# Patient Record
Sex: Female | Born: 1960 | Race: Black or African American | Hispanic: No | Smoking: Never smoker
Health system: Southern US, Community
[De-identification: ages and names within clinical notes are randomized; demographics above are authoritative.]

---

## 2002-08-19 ENCOUNTER — Other Ambulatory Visit: Admission: RE | Admit: 2002-08-19 | Discharge: 2002-08-19 | Payer: Self-pay | Admitting: Obstetrics and Gynecology

## 2002-08-20 ENCOUNTER — Encounter (INDEPENDENT_AMBULATORY_CARE_PROVIDER_SITE_OTHER): Payer: Self-pay

## 2002-08-20 ENCOUNTER — Ambulatory Visit (HOSPITAL_COMMUNITY): Admission: RE | Admit: 2002-08-20 | Discharge: 2002-08-20 | Payer: Self-pay | Admitting: Obstetrics and Gynecology

## 2003-11-03 ENCOUNTER — Inpatient Hospital Stay (HOSPITAL_COMMUNITY): Admission: AD | Admit: 2003-11-03 | Discharge: 2003-11-03 | Payer: Self-pay | Admitting: Obstetrics and Gynecology

## 2003-11-17 ENCOUNTER — Inpatient Hospital Stay (HOSPITAL_COMMUNITY): Admission: AD | Admit: 2003-11-17 | Discharge: 2003-11-20 | Payer: Self-pay | Admitting: Obstetrics and Gynecology

## 2003-11-23 ENCOUNTER — Encounter: Admission: RE | Admit: 2003-11-23 | Discharge: 2003-12-23 | Payer: Self-pay | Admitting: Obstetrics and Gynecology

## 2003-12-21 ENCOUNTER — Other Ambulatory Visit: Admission: RE | Admit: 2003-12-21 | Discharge: 2003-12-21 | Payer: Self-pay | Admitting: Obstetrics and Gynecology

## 2005-02-26 ENCOUNTER — Other Ambulatory Visit: Admission: RE | Admit: 2005-02-26 | Discharge: 2005-02-26 | Payer: Self-pay | Admitting: Obstetrics and Gynecology

## 2005-12-28 ENCOUNTER — Inpatient Hospital Stay: Admission: AD | Admit: 2005-12-28 | Discharge: 2005-12-28 | Payer: Self-pay | Admitting: Obstetrics and Gynecology

## 2005-12-29 ENCOUNTER — Encounter (INDEPENDENT_AMBULATORY_CARE_PROVIDER_SITE_OTHER): Payer: Self-pay | Admitting: Specialist

## 2005-12-29 ENCOUNTER — Ambulatory Visit (HOSPITAL_COMMUNITY): Admission: AD | Admit: 2005-12-29 | Discharge: 2005-12-29 | Payer: Self-pay | Admitting: Obstetrics and Gynecology

## 2006-07-03 ENCOUNTER — Ambulatory Visit (HOSPITAL_COMMUNITY): Admission: RE | Admit: 2006-07-03 | Discharge: 2006-07-03 | Payer: Self-pay | Admitting: Obstetrics and Gynecology

## 2006-07-03 ENCOUNTER — Encounter (INDEPENDENT_AMBULATORY_CARE_PROVIDER_SITE_OTHER): Payer: Self-pay | Admitting: *Deleted

## 2007-12-28 ENCOUNTER — Encounter: Admission: RE | Admit: 2007-12-28 | Discharge: 2007-12-28 | Payer: Self-pay | Admitting: Internal Medicine

## 2009-05-16 ENCOUNTER — Ambulatory Visit (HOSPITAL_COMMUNITY): Admission: RE | Admit: 2009-05-16 | Discharge: 2009-05-16 | Payer: Self-pay | Admitting: Obstetrics and Gynecology

## 2009-05-16 ENCOUNTER — Encounter (INDEPENDENT_AMBULATORY_CARE_PROVIDER_SITE_OTHER): Payer: Self-pay | Admitting: Obstetrics and Gynecology

## 2010-12-14 LAB — CBC
HCT: 37.2 % (ref 36.0–46.0)
MCHC: 34.7 g/dL (ref 30.0–36.0)
MCV: 90.6 fL (ref 78.0–100.0)
Platelets: 305 10*3/uL (ref 150–400)
RDW: 12.8 % (ref 11.5–15.5)
WBC: 6 10*3/uL (ref 4.0–10.5)

## 2010-12-14 LAB — PREGNANCY, URINE: Preg Test, Ur: NEGATIVE

## 2011-01-25 NOTE — Op Note (Signed)
[name], [name]               ACCOUNT NO.:  [id number]   MEDICAL RECORD NO.:  [id number]          PATIENT TYPE:  AMB   LOCATION:  SDC                           FACILITY:  [facility]   PHYSICIAN:  Michelle L. Grewal, M.D.DATE OF BIRTH:  1961/01/02   DATE OF PROCEDURE:  07/03/2006  DATE OF DISCHARGE:                                 OPERATIVE REPORT   PREOPERATIVE DIAGNOSIS:  Missed abortion, Rh positive.   POSTOPERATIVE DIAGNOSIS:  Missed abortion, Rh positive.   PROCEDURE:  Dilatation and evacuation.   SURGEON:  Dr. Vincente Poli.   ANESTHESIA:  Local with MAC.   SPECIMENS:  Products of conception.   ESTIMATED BLOOD LOSS:  Was minimal.   COMPLICATIONS:  None.   PROCEDURE:  Patient taken to the operating room.  She was prepped and draped  in the usual sterile fashion.  In-and-out catheter was used to empty the  bladder.  Speculum was placed in the vagina.  The cervix was grasped with a  tenaculum and a paracervical block was performed in the standard fashion.  The cervical internal os was gently dilated using Pratt dilators.  A #7  suction cannula was inserted into the uterus and the uterus thoroughly  suctioned of all tissue which is grossly consistent with products of  conception.  This was done twice. The suction cannula was removed and then a  sharp curette is inserted and the uterus was thoroughly curetted of all  tissue.  A final suction curettage was performed.  At the end of the  procedure no bleeding is noted.  All instruments removed from the vagina.  All sponge, lap and instrument counts were correct x2.  The patient went to  recovery room in stable condition.   PATHOLOGY:  Products of conception sent to pathology.      Michelle L. Vincente Poli, M.D.  Electronically Signed     MLG/MEDQ  D:  07/03/2006  T:  07/04/2006  Job:  [id number]

## 2011-01-25 NOTE — Op Note (Signed)
[name], [name]               ACCOUNT NO.:  [id number]   MEDICAL RECORD NO.:  [id number]          PATIENT TYPE:  AMB   LOCATION:  SDC                           FACILITY:  [facility]   PHYSICIAN:  Michelle L. Grewal, M.D.DATE OF BIRTH:  Jul 23, 1961   DATE OF PROCEDURE:  12/29/2005  DATE OF DISCHARGE:  12/29/2005                                 OPERATIVE REPORT   PREOP DIAGNOSIS:  Missed abortion.   POSTOP DIAGNOSIS:  Missed abortion.   PROCEDURE:  Dilatation and evacuation.   SURGEON:  Michelle L. Vincente Poli, M.D.   ANESTHESIA:  MAC with local.   SPECIMENS:  Products of conception.   ESTIMATED BLOOD LOSS:  Minimal.   PROCEDURE:  Patient was taken to the operating room. She was then given  sedation placed in a lithotomy position. She was prepped and draped in the  usual sterile fashion. Exam under anesthesia revealed the uterus was  retroverted about 7-8 weeks size.  The speculum was inserted, the cervix was  grasped with a tenaculum; and the paracervical block was performed in a  standard fashion. The cervical internal os was gently dilated using Pratt  dilators. A #7 suction cannula was inserted into the uterus and the uterus  was thoroughly suctioned of all tissue.  A sharp curette was inserted, and a  final sharp curettage was performed a final suction curettage was then  performed retrieving scant tissue.  At the end of procedure the uterus was  cleaned of all tissue.  Half of the specimen was sent for chromosomes, and  half to pathology at the end of the procedure no bleeding was noted.   All instruments were removed from the vagina. All sponge, lap and instrument  counts were correct x2. The patient went to recovery room in stable  condition.      Michelle L. Vincente Poli, M.D.  Electronically Signed     MLG/MEDQ  D:  12/29/2005  T:  12/31/2005  Job:  [id number]

## 2011-01-25 NOTE — Op Note (Signed)
   NAMEKRISTIAN, [name]                         ACCOUNT NO.:  [id number]   MEDICAL RECORD NO.:  [id number]                   PATIENT TYPE:  AMB   LOCATION:  SDC                                  [facility]ILITY:  [facility]   PHYSICIAN:  Michelle L. Vincente Poli, M.D.            DATE OF BIRTH:  [birth year]   DATE OF PROCEDURE:  08/20/2002  DATE OF DISCHARGE:                                 OPERATIVE REPORT   PREOPERATIVE DIAGNOSES:  Missed abortion.   POSTOPERATIVE DIAGNOSES:  Missed abortion.   PROCEDURE:  Dilatation and evacuation.   SURGEON:  Michelle L. Vincente Poli, M.D.   ANESTHESIA:  Paracervical block with IV sedation.   PROCEDURE:  This patient was taken to the operating room.  She was given  sedation and placed in the lithotomy position.  Vagina and vulva were  prepped and draped in the usual sterile fashion and in-and-out catheter was  used to empty the bladder.  Speculum was inserted into the vagina.  The  cervix was grasped with a tenaculum and a paracervical block was performed  at 5 and 7 o'clock.  Of note, the patient had a history of a LEEP cone  biopsy.  The cervix was quite stenotic, but was dilated with a Warehouse manager.  A number 7 suction cannula was inserted into the uterus and a  suction curettage was performed of contents consistent with products of  conception.  Sharp curette was inserted and the uterus was thoroughly  curetted and there was no remaining tissue left.  At the end of the  procedure there was no vaginal bleeding noted.  All sponge, lap, and  instrument counts were correct x2 and the tissue was sent for chromosome  analysis.                                               Michelle L. Vincente Poli, M.D.    [name]  D:  08/20/2002  T:  08/20/2002  Job:  [id number]

## 2011-02-07 ENCOUNTER — Encounter (HOSPITAL_COMMUNITY): Payer: BC Managed Care – PPO

## 2011-02-07 LAB — CBC
MCH: 29.3 pg (ref 26.0–34.0)
MCHC: 33.7 g/dL (ref 30.0–36.0)
RDW: 12.9 % (ref 11.5–15.5)

## 2011-02-07 LAB — BASIC METABOLIC PANEL
Calcium: 9.3 mg/dL (ref 8.4–10.5)
Creatinine, Ser: 1 mg/dL (ref 0.4–1.2)
GFR calc Af Amer: >60 mL/min (ref >60)
GFR calc non Af Amer: 59 mL/min — ABNORMAL LOW (ref >60)
Sodium: 132 mEq/L — ABNORMAL LOW (ref 135–145)

## 2011-02-07 LAB — SURGICAL PCR SCREEN: MRSA, PCR: NEGATIVE

## 2011-02-14 ENCOUNTER — Inpatient Hospital Stay (HOSPITAL_COMMUNITY)
Admission: RE | Admit: 2011-02-14 | Discharge: 2011-02-16 | DRG: 359 | Disposition: A | Discharge status: Home / Self Care | Payer: BC Managed Care – PPO | Source: Ambulatory Visit | Attending: Obstetrics and Gynecology | Admitting: Obstetrics and Gynecology

## 2011-02-14 ENCOUNTER — Other Ambulatory Visit: Payer: Self-pay | Admitting: Obstetrics and Gynecology

## 2011-02-14 DIAGNOSIS — N920 Excessive and frequent menstruation with regular cycle: Secondary | ICD-10-CM | POA: Diagnosis present

## 2011-02-14 DIAGNOSIS — D252 Subserosal leiomyoma of uterus: Principal | ICD-10-CM | POA: Diagnosis present

## 2011-02-14 DIAGNOSIS — Z01812 Encounter for preprocedural laboratory examination: Secondary | ICD-10-CM

## 2011-02-14 DIAGNOSIS — Z01818 Encounter for other preprocedural examination: Secondary | ICD-10-CM

## 2011-02-14 DIAGNOSIS — D251 Intramural leiomyoma of uterus: Secondary | ICD-10-CM | POA: Diagnosis present

## 2011-02-14 DIAGNOSIS — D250 Submucous leiomyoma of uterus: Secondary | ICD-10-CM | POA: Diagnosis present

## 2011-02-14 LAB — PREGNANCY, URINE: Preg Test, Ur: NEGATIVE

## 2011-02-15 LAB — CBC
HCT: 32.4 % — ABNORMAL LOW (ref 36.0–46.0)
Hemoglobin: 10.6 g/dL — ABNORMAL LOW (ref 12.0–15.0)
MCH: 28.6 pg (ref 26.0–34.0)
MCHC: 32.7 g/dL (ref 30.0–36.0)
RDW: 12.8 % (ref 11.5–15.5)

## 2011-02-15 NOTE — Op Note (Addendum)
[name], [name]               ACCOUNT NO.:  [id number]  MEDICAL RECORD NO.:  [id number]  LOCATION:  9311                          FACILITY:  [facility]  PHYSICIAN:  Miqueas Whilden L. Ziere Docken, M.D.DATE OF BIRTH:  09-Dec-1960  DATE OF PROCEDURE:  02/14/2011 DATE OF DISCHARGE:                              OPERATIVE REPORT   PREOPERATIVE DIAGNOSIS:  Symptomatic fibroids.  POSTOPERATIVE DIAGNOSIS:  Symptomatic fibroids.  PROCEDURE:  Total abdominal hysterectomy.  SURGEON:  Meika Earll L. Vincente Poli, MD  ASSISTANT SURGEON:  Richarda Overlie, MD  ANESTHESIA:  General.  FINDINGS:  Large myomatous uterus, normal adnexa.  SPECIMENS:  Uterus and cervix sent to Pathology.  ESTIMATED BLOOD LOSS:  100 mL.  COMPLICATIONS:  None.  DESCRIPTION OF PROCEDURE:  The patient was taken to the operating room. She was intubated.  She was prepped and draped in the usual sterile fashion.  A Foley catheter was inserted and draining clear urine.  A low transverse incision was made, carried down to the fascia.  Fascia was scored in the midline, extended laterally.  Rectus muscles were separated in the midline.  The peritoneum was entered bluntly.  The peritoneal incision was then stretched.  The self-retaining retractor was placed in the abdominal cavity.  Large and small bowel were placed in the upper abdomen.  Exam revealed that she had a very large myomatous uterus with several fibroids that were subserosal and pedunculated. Adnexa were normal.  We then identified the uterus.  We grasped on either side of the triple pedicle using Kelly clamps and elevated the uterus.  We then identified the round ligament, suture ligated the round ligament, transected the round ligament, and the anterior and posterior leaves of the broad ligament initially on the right side and on the left side with excellent hemostasis.  After the uterine arteries were skeletonized, we then elevated the uterus even further and clamped  the uterine artery initially on the right and then on the left with curved Heaney clamps with careful attention that the bladder flap had been developed and the bladder was well below our clamps.  The pedicles were clamped, cut, and suture ligated using 0 Vicryl suture.  The cervix was very short.  The uterosacral cardinal ligament complexes were then identified and clamped, snug beside the cervix with a straight Heaney clamps on each side.  Each pedicle was clamped, cut, and suture ligated using 0 Vicryl suture.  We then saw the external cervical os with the external cervix and placed curved Heaney clamps beneath that on either side with careful attention, the bladder was well below our clamps.  The specimen was removed and identified as uterus and cervix was sent to Pathology for analysis.  The vaginal cuff angle stitches were then placed using 0 Vicryl suture and the remainder of the cuff was closed with 2 figure-of-eight using 0 Vicryl suture.  Irrigation was performed. Hemostasis was excellent.  The ovaries were transfixed to the round ligament on either side using 0 Vicryl suture.  Hemostasis was excellent.  All laparotomy pads and instruments were removed from the abdominal cavity.  The peritoneum was closed using 0 Vicryl running stitch.  The rectus muscles were reapproximated using 0  Vicryl.  The fascia was closed using 0 Vicryl, starting each corner, meeting in midline with a running stitch using 0 Vicryl.  After irrigation of subcutaneous layer and noting hemostasis, the skin was closed with 3-0 Vicryl with a Mellody Dance needle.  Local was infiltrated.  Dermabond was applied.  All sponge, lap, and instrument counts were correct x2.  The patient was extubated and went to recovery room in stable condition.     Shanautica Forker L. Vincente Poli, M.D.     Florestine Avers  D:  02/14/2011  T:  02/14/2011  Job:  [id number]  Electronically Signed by Marcelle Overlie M.D. on 03/19/2011 02:01:07 PM

## 2011-02-15 NOTE — H&P (Signed)
  [name], [name]                  ACCOUNT NO.:  [id number]  MEDICAL RECORD NO.:  [id number]  LOCATION:                                FACILITY:  [facility]  PHYSICIAN:  Traniece Boffa L. Magali Bray, M.D.DATE OF BIRTH:  09-Apr-1961  DATE OF ADMISSION:  02/14/2011 DATE OF DISCHARGE:                             HISTORY & PHYSICAL   HISTORY OF PRESENT ILLNESS:  This is a 50 year old G4, P1 who presents today for total abdominal hysterectomy.  She has known uterine fibroids. She has previously been on birth control pills and it is noted despite that, she is having increased bleeding, cramping, and also some headaches.  She has previously had not wanted hysterectomy, but now she wants one.  MEDICAL HISTORY:  Significant for the fact that she did have a D and C, hysteroscopy, and resection of fibroids back in 2010.  The patient reports that her periods lasts for about a week, very heavy clotting with a lot of cramping.  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  Significant for headaches and migraines.  CURRENT MEDICATIONS:  Singulair, Zyrtec, Lutera, Maxalt as needed.  She is allergic to PENICILLIN.  SURGERY HISTORY:  She had a D and C in September 2010.  She has had 1 vaginal delivery.  SOCIAL HISTORY:  Denies any tobacco or alcohol.  FAMILY HISTORY:  Significant for hypothyroidism and diabetes.  REVIEW OF SYSTEMS:  Positive for irregular menstrual bleeding.  PHYSICAL EXAMINATION:  VITAL SIGNS:  Weight 149, her last blood pressure was 128/86, weight 151, BMI 23, height 5 feet 8 inches. GENERAL:  Alert and oriented. LUNGS:  Clear to auscultation bilaterally. CARDIAC:  Regular rate and rhythm. BREASTS:  Soft, nontender.  No masses. PELVIC:  External genitalia within normal limits.  Vagina appears normal.  Cervix, no lesions.  Uterus is about 12-14 weeks' size with multiple fibroids palpated.  IMPRESSION:  Symptomatic fibroids and menorrhagia.  PLAN:   We will proceed with total abdominal hysterectomy.  Risks and benefits have been reviewed with the patient.  All questions are answered.  We will proceed with surgery.     Dvaughn Fickle L. Vincente Poli, M.D.     Florestine Avers  D:  02/11/2011  T:  02/11/2011  Job:  [id number]  Electronically Signed by Marcelle Overlie M.D. on 02/15/2011 07:14:37 AM

## 2011-11-13 ENCOUNTER — Other Ambulatory Visit: Payer: Self-pay | Admitting: Obstetrics and Gynecology

## 2011-11-15 ENCOUNTER — Other Ambulatory Visit: Payer: Self-pay | Admitting: Obstetrics and Gynecology

## 2011-11-15 DIAGNOSIS — R928 Other abnormal and inconclusive findings on diagnostic imaging of breast: Secondary | ICD-10-CM

## 2011-11-22 ENCOUNTER — Ambulatory Visit
Admission: RE | Admit: 2011-11-22 | Discharge: 2011-11-22 | Disposition: A | Discharge status: Home / Self Care | Payer: BC Managed Care – PPO | Source: Ambulatory Visit | Attending: Obstetrics and Gynecology | Admitting: Obstetrics and Gynecology

## 2011-11-22 ENCOUNTER — Other Ambulatory Visit: Payer: Self-pay | Admitting: Obstetrics and Gynecology

## 2011-11-22 DIAGNOSIS — N6310 Unspecified lump in the right breast, unspecified quadrant: Secondary | ICD-10-CM

## 2011-11-22 DIAGNOSIS — R928 Other abnormal and inconclusive findings on diagnostic imaging of breast: Secondary | ICD-10-CM

## 2011-11-25 ENCOUNTER — Other Ambulatory Visit: Payer: Self-pay | Admitting: Obstetrics and Gynecology

## 2011-11-25 ENCOUNTER — Ambulatory Visit
Admission: RE | Admit: 2011-11-25 | Discharge: 2011-11-25 | Disposition: A | Discharge status: Home / Self Care | Payer: BC Managed Care – PPO | Source: Ambulatory Visit | Attending: Obstetrics and Gynecology | Admitting: Obstetrics and Gynecology

## 2011-11-25 DIAGNOSIS — N6310 Unspecified lump in the right breast, unspecified quadrant: Secondary | ICD-10-CM

## 2013-09-09 HISTORY — PX: HYSTEROTOMY: SHX1776

## 2014-02-21 ENCOUNTER — Other Ambulatory Visit: Payer: Self-pay | Admitting: Obstetrics and Gynecology

## 2014-02-22 LAB — CYTOLOGY - PAP

## 2015-03-02 ENCOUNTER — Other Ambulatory Visit: Payer: Self-pay | Admitting: Obstetrics and Gynecology

## 2015-03-03 LAB — CYTOLOGY - PAP

## 2016-05-02 ENCOUNTER — Other Ambulatory Visit: Payer: Self-pay | Admitting: Obstetrics and Gynecology

## 2016-05-02 DIAGNOSIS — R928 Other abnormal and inconclusive findings on diagnostic imaging of breast: Secondary | ICD-10-CM

## 2016-05-07 ENCOUNTER — Ambulatory Visit
Admission: RE | Admit: 2016-05-07 | Discharge: 2016-05-07 | Disposition: A | Discharge status: Home / Self Care | Payer: BC Managed Care – PPO | Source: Ambulatory Visit | Attending: Obstetrics and Gynecology | Admitting: Obstetrics and Gynecology

## 2016-05-07 DIAGNOSIS — R928 Other abnormal and inconclusive findings on diagnostic imaging of breast: Secondary | ICD-10-CM

## 2016-07-24 ENCOUNTER — Ambulatory Visit
Admission: RE | Admit: 2016-07-24 | Discharge: 2016-07-24 | Disposition: A | Discharge status: Home / Self Care | Payer: BC Managed Care – PPO | Source: Ambulatory Visit | Attending: Family Medicine | Admitting: Family Medicine

## 2016-07-24 ENCOUNTER — Other Ambulatory Visit: Payer: Self-pay | Admitting: Family Medicine

## 2016-07-24 DIAGNOSIS — M545 Low back pain: Secondary | ICD-10-CM

## 2018-08-29 IMAGING — MG 2D DIGITAL DIAGNOSTIC UNILATERAL RIGHT MAMMOGRAM WITH CAD AND AD
6 series · 6 of 14 positions shown · non-contrast
Comparison: 04/29/2016 and multiple priors dating back to [id number]

CLINICAL DATA: Asymmetry right breast identified only on the MLO
view of the recent 2D screening mammogram.

EXAM:
2D DIGITAL DIAGNOSTIC UNILATERAL RIGHT MAMMOGRAM WITH CAD AND
ADJUNCT TOMO

[R CC synth-2D]
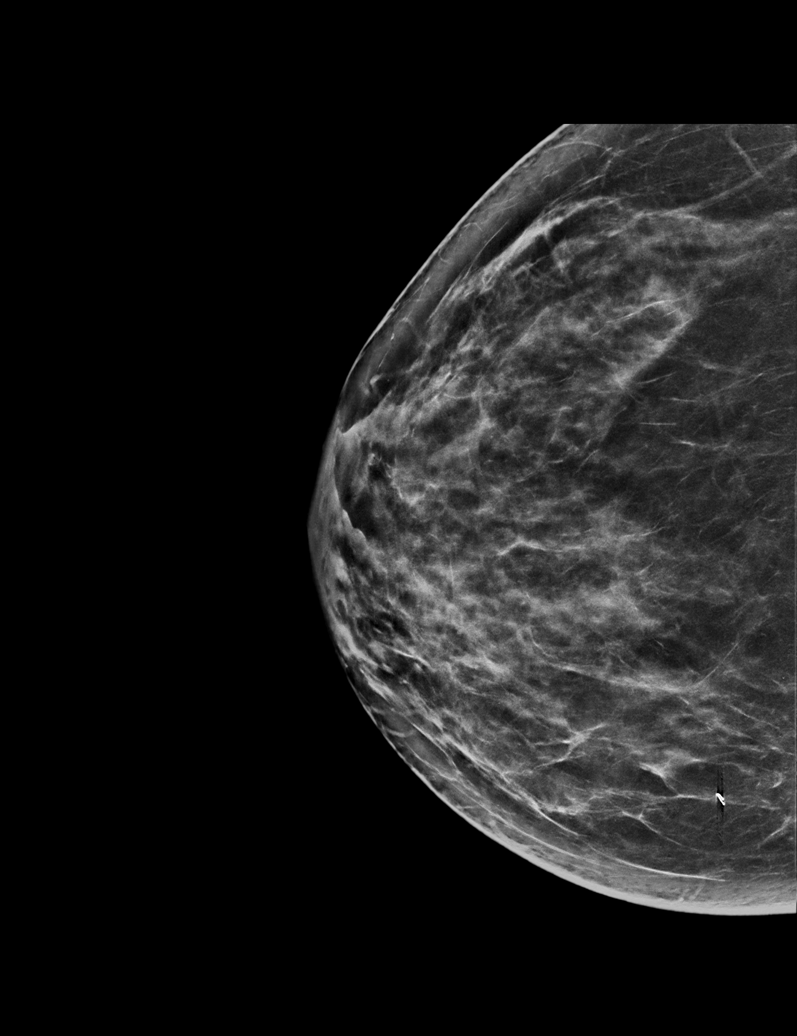

[R CC]
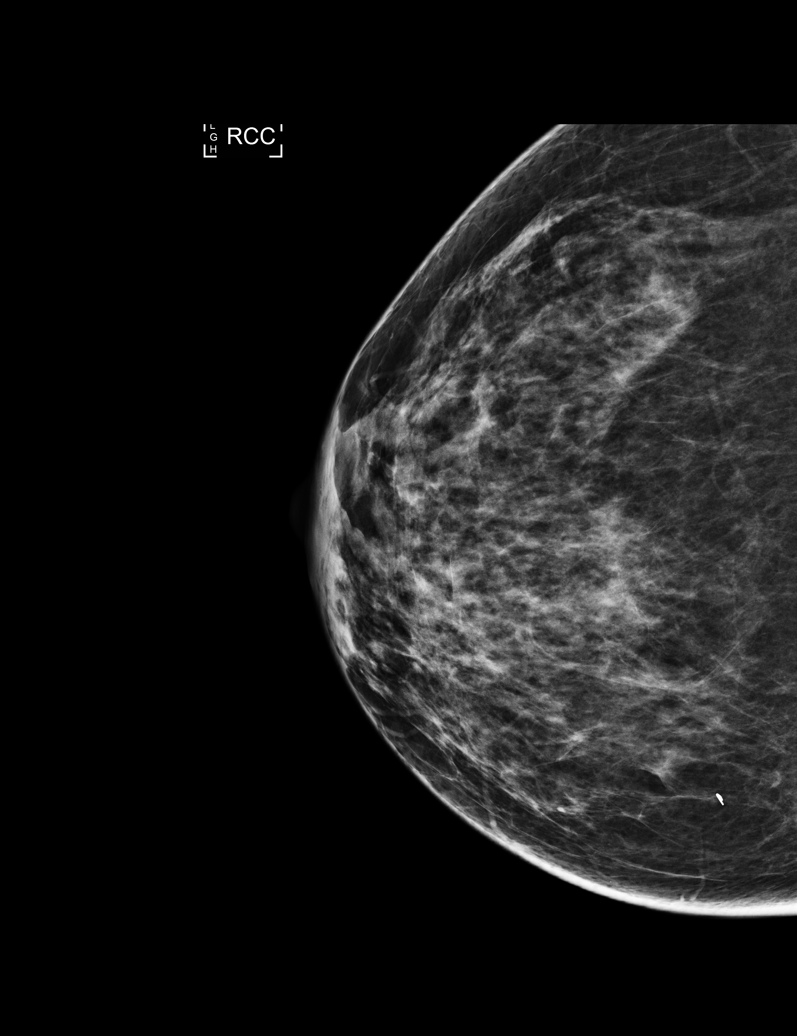

[R MLO synth-2D]
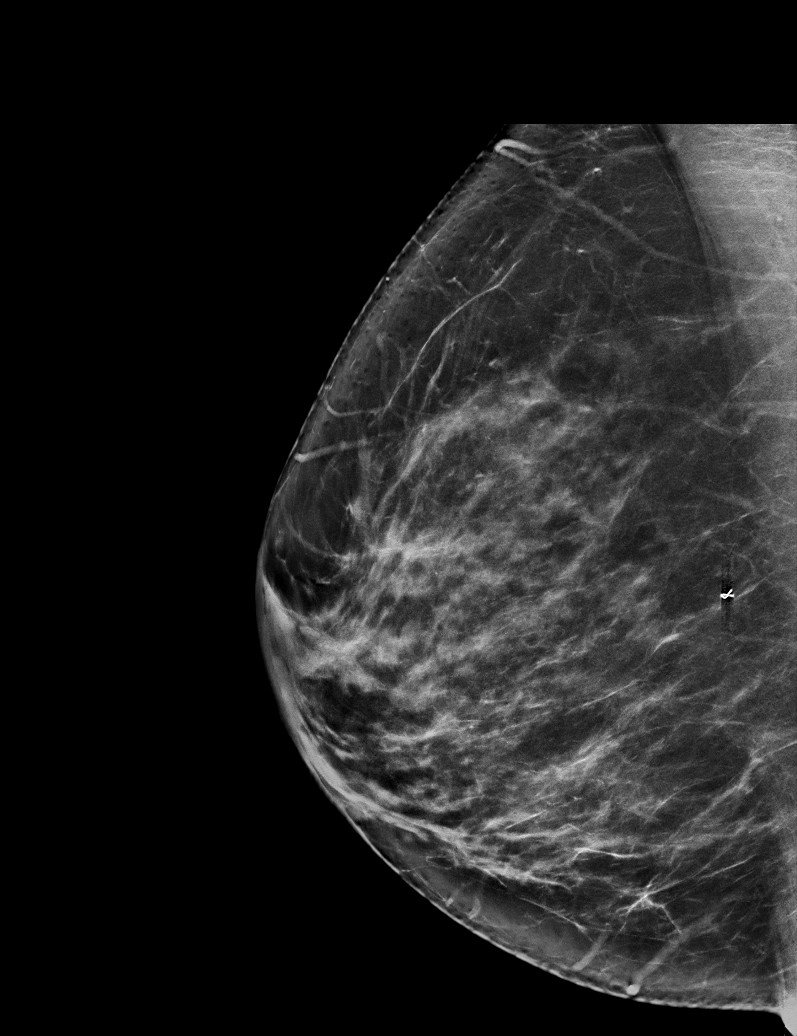

[R MLO]
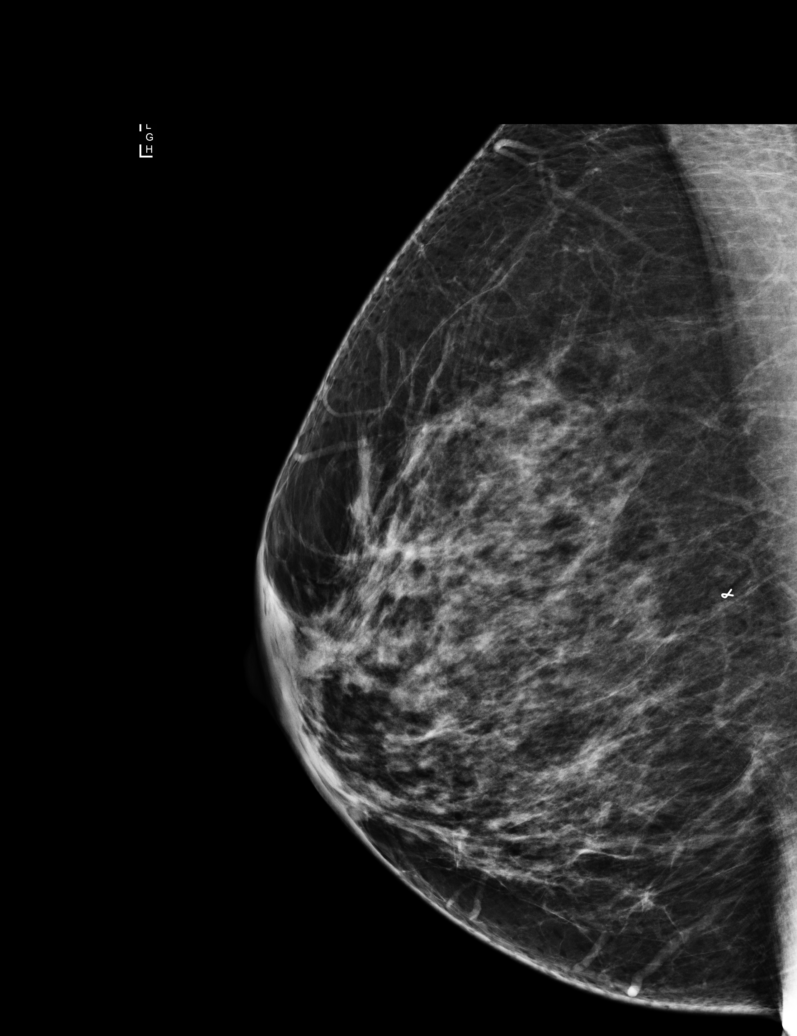

[R MLO tomo · tomo slice 43/85.0]
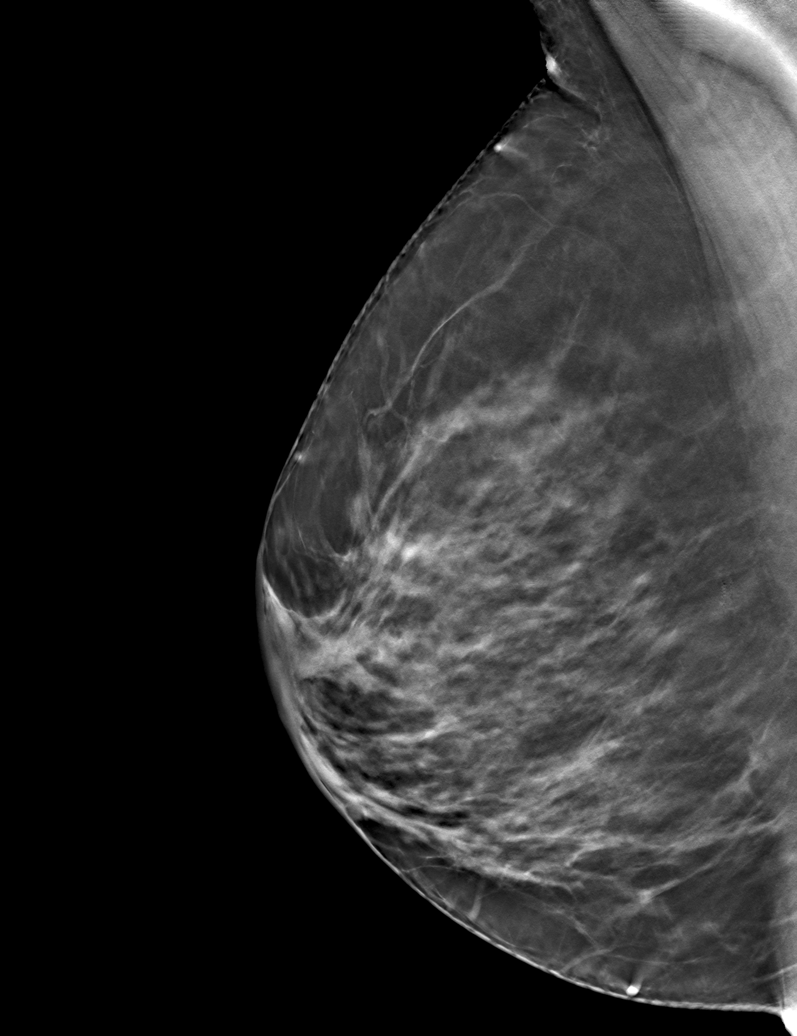

[R CC tomo · tomo slice 43/84.0]
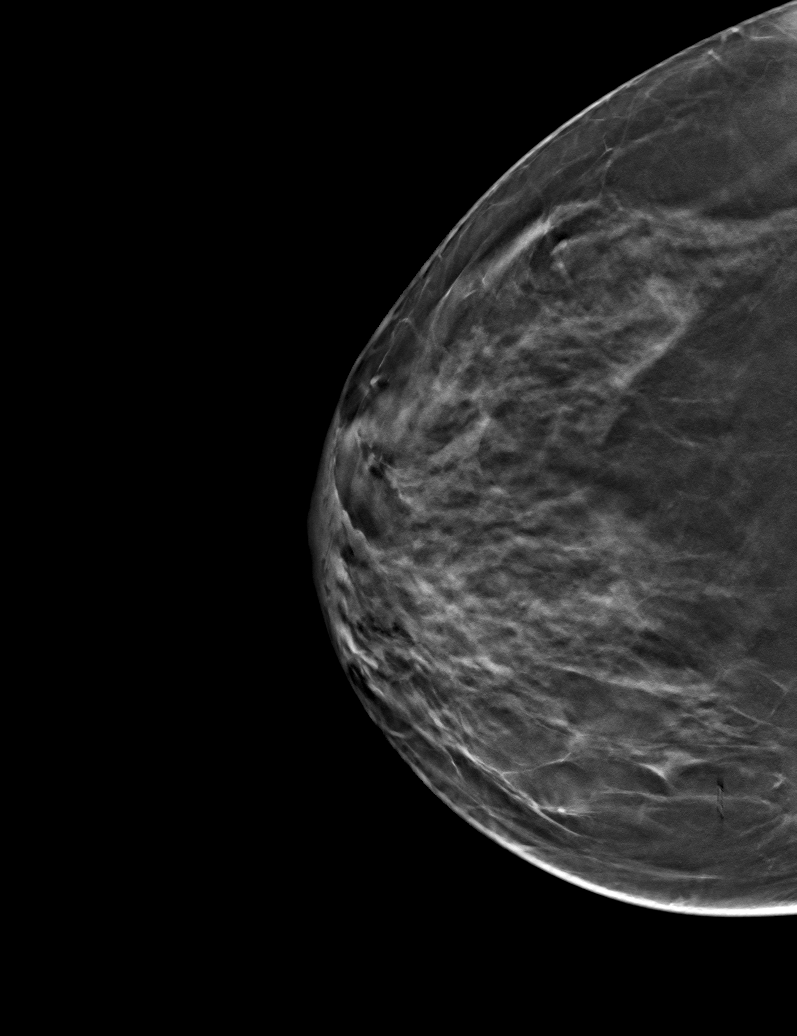

[6 of 14 positions shown; findings below may reference images not displayed]

ACR Breast Density Category b: There are scattered areas of
fibroglandular density.
FINDINGS: Whole breast 3D images in the CC and MLO projections including
tomographic views show normal and stable appearing fibroglandular
tissue posteriorly in the inferior right breast. There is no
evidence of mass or architectural distortion in the right breast.

Mammographic images were processed with CAD.
IMPRESSION: No evidence of malignancy in the right breast.

RECOMMENDATION:
Screening mammogram in one year.(Code:9M-2-GB0)

I have discussed the findings and recommendations with the patient.
Results were also provided in writing at the conclusion of the
visit. If applicable, a reminder letter will be sent to the patient
regarding the next appointment.

BI-RADS CATEGORY  1: Negative.

## 2019-05-27 ENCOUNTER — Other Ambulatory Visit: Payer: Self-pay

## 2019-05-27 DIAGNOSIS — G43909 Migraine, unspecified, not intractable, without status migrainosus: Secondary | ICD-10-CM | POA: Insufficient documentation

## 2019-05-27 DIAGNOSIS — J45909 Unspecified asthma, uncomplicated: Secondary | ICD-10-CM | POA: Insufficient documentation

## 2019-05-27 DIAGNOSIS — Z20822 Contact with and (suspected) exposure to covid-19: Secondary | ICD-10-CM

## 2019-05-29 LAB — NOVEL CORONAVIRUS, NAA: SARS-CoV-2, NAA: NOT DETECTED

## 2019-07-30 ENCOUNTER — Other Ambulatory Visit: Payer: Self-pay

## 2019-07-30 DIAGNOSIS — Z20822 Contact with and (suspected) exposure to covid-19: Secondary | ICD-10-CM

## 2019-08-02 LAB — NOVEL CORONAVIRUS, NAA: SARS-CoV-2, NAA: NOT DETECTED

## 2021-11-02 ENCOUNTER — Ambulatory Visit: Payer: BC Managed Care – PPO | Admitting: Podiatry

## 2021-11-02 ENCOUNTER — Other Ambulatory Visit: Payer: Self-pay

## 2021-11-02 DIAGNOSIS — Q828 Other specified congenital malformations of skin: Secondary | ICD-10-CM | POA: Diagnosis not present

## 2021-11-02 DIAGNOSIS — R87619 Unspecified abnormal cytological findings in specimens from cervix uteri: Secondary | ICD-10-CM | POA: Insufficient documentation

## 2021-11-02 DIAGNOSIS — T7840XA Allergy, unspecified, initial encounter: Secondary | ICD-10-CM | POA: Insufficient documentation

## 2021-11-02 DIAGNOSIS — J301 Allergic rhinitis due to pollen: Secondary | ICD-10-CM | POA: Insufficient documentation

## 2021-11-08 NOTE — Progress Notes (Signed)
Subjective:  Patient ID: [name], female    DOB: 05/19/1961,  MRN: [id number]  Chief Complaint  Patient presents with   Callouses    Right hallux     61 y.o. female presents with the above complaint.  Patient presents with new complaint of right submetatarsal 1 porokeratotic lesion/possible foreign body.  Patient states that has been going for 2 months has progressed gotten worse with some soreness in the ball of the foot.  There is no redness associated with it.  But she wanted to get it eval make sure that there is nothing else going on.  It hurts with ambulation.  Pain scale 7 out of 10.  It is sharp stabbing in nature especially his the right way.   Review of Systems: Negative except as noted in the HPI. Denies N/V/F/Ch.  No past medical history on file.  Current Outpatient Medications:    montelukast (SINGULAIR) 10 MG tablet, montelukast 10 mg tablet  TAKE 1 TABLET BY MOUTH EVERY DAY, Disp: , Rfl:    albuterol (VENTOLIN HFA) 108 (90 Base) MCG/ACT inhaler, , Disp: , Rfl:    cyclobenzaprine (FLEXERIL) 10 MG tablet, cyclobenzaprine 10 mg tablet  TAKE 1/2 TO 1 TABLET BY MOUTH EVERY DAY AS NEEDED, Disp: , Rfl:    estradiol (VIVELLE-DOT) 0.1 MG/24HR patch, estradiol 0.1 mg/24 hr semiweekly transdermal patch  APPLY 1 PATCH TWICE WEEKLY, Disp: , Rfl:    gabapentin (NEURONTIN) 300 MG capsule, gabapentin 300 mg capsule  TAKE 1 CAPSULE BY MOUTH THREE TIMES DAILY, Disp: , Rfl:    levocetirizine (XYZAL) 5 MG tablet, SMARTSIG:1 Tablet(s) By Mouth Every Evening, Disp: , Rfl:    prednisoLONE acetate (PRED FORTE) 1 % ophthalmic suspension, prednisolone acetate 1 % eye drops,suspension, Disp: , Rfl:    SUMAtriptan (IMITREX) 50 MG tablet, sumatriptan 50 mg tablet  TAKE 1 TABLET BY MOUTH AS NEEDED FOR MIGRAINE, Disp: , Rfl:   Social History   Tobacco Use  Smoking Status Not on file  Smokeless Tobacco Not on file    Allergies  Allergen Reactions   Crab Extract Allergy Skin Test    Oat  Grain Extract Allergy Skin Test Dermatitis, Hives and Itching   Penicillins Hives and Itching   Shellfish Allergy Dermatitis, Hives, Itching and Rash   Objective:  There were no vitals filed for this visit. There is no height or weight on file to calculate BMI. Constitutional Well developed. Well nourished.  Vascular Dorsalis pedis pulses palpable bilaterally. Posterior tibial pulses palpable bilaterally. Capillary refill normal to all digits.  No cyanosis or clubbing noted. Pedal hair growth normal.  Neurologic Normal speech. Oriented to person, place, and time. Epicritic sensation to light touch grossly present bilaterally.  Dermatologic Patient presents with right submetatarsal 1 hyperkeratotic lesion/porokeratotic lesion.  Mild pain on palpation.  No pinpoint bleeding noted.  No other bony abnormalities identified  Orthopedic: Normal joint ROM without pain or crepitus bilaterally. No visible deformities. No bony tenderness.   Radiographs: None Assessment:   1. Porokeratosis    Plan:  Patient was evaluated and treated and all questions answered.  Right submetatarsal 1 hyperkeratotic lesion versus superficial foreign body -All questions and concerns were discussed with the patient in extensive detail -At this time I do see an entry point but I do not see anything inside the entry point however patient will benefit from debridement.  Using chisel blade handle the lesion was debrided down to healthy striated tissue.  No signs of foreign body noted.  Central nucleated core noted consistent with porokeratosis.  I discussed with the patient that if he continues to bother her to come back and see me and we can discuss other options to have it removed.  She states understanding.  For right now she is feeling much better.  No follow-ups on file.

## 2022-04-09 HISTORY — PX: TURBINATE REDUCTION: SHX6157

## 2022-08-26 ENCOUNTER — Other Ambulatory Visit: Payer: Self-pay | Admitting: Obstetrics and Gynecology

## 2022-08-26 DIAGNOSIS — R928 Other abnormal and inconclusive findings on diagnostic imaging of breast: Secondary | ICD-10-CM

## 2022-09-10 ENCOUNTER — Ambulatory Visit: Payer: BC Managed Care – PPO

## 2022-09-10 ENCOUNTER — Ambulatory Visit
Admission: RE | Admit: 2022-09-10 | Discharge: 2022-09-10 | Disposition: A | Discharge status: Home / Self Care | Payer: BC Managed Care – PPO | Source: Ambulatory Visit | Attending: Obstetrics and Gynecology | Admitting: Obstetrics and Gynecology

## 2022-09-10 DIAGNOSIS — R928 Other abnormal and inconclusive findings on diagnostic imaging of breast: Secondary | ICD-10-CM

## 2022-11-12 ENCOUNTER — Other Ambulatory Visit: Payer: Self-pay | Admitting: Family Medicine

## 2022-11-12 DIAGNOSIS — Z0000 Encounter for general adult medical examination without abnormal findings: Secondary | ICD-10-CM

## 2022-12-09 ENCOUNTER — Ambulatory Visit
Admission: RE | Admit: 2022-12-09 | Discharge: 2022-12-09 | Disposition: A | Discharge status: Home / Self Care | Payer: BC Managed Care – PPO | Source: Ambulatory Visit | Attending: Family Medicine | Admitting: Family Medicine

## 2022-12-09 DIAGNOSIS — Z0000 Encounter for general adult medical examination without abnormal findings: Secondary | ICD-10-CM

## 2023-12-18 ENCOUNTER — Ambulatory Visit: Payer: Self-pay | Admitting: Allergy & Immunology

## 2023-12-18 ENCOUNTER — Encounter: Payer: Self-pay | Admitting: Allergy & Immunology

## 2023-12-18 ENCOUNTER — Other Ambulatory Visit: Payer: Self-pay

## 2023-12-18 VITALS — BP 130/88 | HR 87 | Temp 98.0°F | Resp 14 | Ht 67.0 in | Wt 164.9 lb

## 2023-12-18 DIAGNOSIS — L2089 Other atopic dermatitis: Secondary | ICD-10-CM

## 2023-12-18 DIAGNOSIS — J4530 Mild persistent asthma, uncomplicated: Secondary | ICD-10-CM

## 2023-12-18 DIAGNOSIS — J310 Chronic rhinitis: Secondary | ICD-10-CM

## 2023-12-18 NOTE — Patient Instructions (Addendum)
 1. Mild persistent asthma, uncomplicated (Primary) - Lung testing looked amazing today.  - We are not going to make any changes at this point in time.  - Daily controller medication(s): NONE - Prior to physical activity: albuterol 2 puffs 10-15 minutes before physical activity. - Rescue medications: albuterol 4 puffs every 4-6 hours as needed - Asthma control goals:  * Full participation in all desired activities (may need albuterol before activity) * Albuterol use two time or less a week on average (not counting use with activity) * Cough interfering with sleep two time or less a month * Oral steroids no more than once a year * No hospitalizations  2. Chronic rhinitis - Because of insurance stipulations, we cannot do skin testing on the same day as your first visit. - We are all working to fight this, but for now we need to do two separate visits.  - We will know more after we do testing at the next visit.  - The skin testing visit can be squeezed in at your convenience.  - Then we can make a more full plan to address all of your symptoms. - Be sure to stop your antihistamines for 3 days before this appointment.   3. Flexural atopic dermatitis - Skin looks pristine. - Continue with the moisturizing as you are doing. - We can send in a prescription cream if you are interested in needing that/  4. Return in about 1 week (around 12/25/2023) for ALLERGY TESTING (1-55 + select foods, including oats and seafood). You can have the follow up appointment with Dr. Dellis Anes or a Nurse Practicioner (our Nurse Practitioners are excellent and always have Physician oversight!).    Please inform us of any Emergency Department visits, hospitalizations, or changes in symptoms. Call us before going to the ED for breathing or allergy symptoms since we might be able to fit you in for a sick visit. Feel free to contact us anytime with any questions, problems, or concerns.  It was a pleasure to meet you  today!  Websites that have reliable patient information: 1. American Academy of Asthma, Allergy, and Immunology: www.aaaai.org 2. Food Allergy Research and Education (FARE): foodallergy.org 3. Mothers of Asthmatics: http://www.asthmacommunitynetwork.org 4. American College of Allergy, Asthma, and Immunology: www.acaai.org      "Like" [place] on Facebook and Instagram for our latest updates!      A healthy democracy works best when [employer] participate! Make sure you are registered to vote! If you have moved or changed any of your contact information, you will need to get this updated before voting! Scan the QR codes below to learn more!

## 2023-12-18 NOTE — Progress Notes (Signed)
 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 Marene Lenz)  Plan/Recommendations:   1. Mild persistent asthma, uncomplicated (Primary) - Lung testing looked amazing today.  - We are not going to make any changes at this point in time.  - Daily controller medication(s): NONE - Prior to physical activity: albuterol 2 puffs 10-15 minutes before physical activity. - Rescue medications: albuterol 4 puffs every 4-6 hours as needed - Asthma control goals:  * Full participation in all desired activities (may need albuterol before activity) * Albuterol use two time or less a week on average (not counting use with activity) * Cough interfering with sleep two time or less a month * Oral steroids no more than once a year * No hospitalizations  2. Chronic rhinitis - Because of insurance stipulations, we cannot do skin testing on the same day as your first visit. - We are all working to fight this, but for now we need to do two separate visits.  - We will know more after we do testing at the next visit.  - The skin testing visit can be squeezed in at your convenience.  - Then we can make a more full plan to address all of your symptoms. - Be sure to stop your antihistamines for 3 days before this appointment.   3. Flexural atopic dermatitis - Skin looks pristine. - Continue with the moisturizing as you are doing. - We can send in a prescription cream if you are interested in needing that/  4. Return in about 1 week (around 12/25/2023) for ALLERGY TESTING (1-55 + select foods, including oats and seafood). You can have the follow up  appointment with Dr. Dellis Anes or a Nurse Practicioner (our Nurse Practitioners are excellent and always have Physician oversight!).     This note in its entirety was forwarded to the Provider who requested this consultation.  Subjective:   [name] is a 63 y.o. female presenting today for evaluation of  Chief Complaint  Patient presents with   Asthma    Says she had flare ups twice during the winter when she was sick with the flu otherwise she has no issues.   Allergic Rhinitis     Says she thinks she is allergic to everything outside. She does express concern to food allergies. Crab and lobster she had hives and throat swelling. Oats she also tested positive to but has never had a reaction to it.    Eczema    Says she uses her daughters topical medication     [name] has a history of the following: Patient Active Problem List   Diagnosis Date Noted   Abnormal cervical Papanicolaou smear 11/02/2021   Allergies 11/02/2021   Hayfever 11/02/2021   Asthma 05/27/2019   Migraine headache 05/27/2019    History obtained from: chart review and patient.  Discussed the use of AI scribe software for clinical note transcription with the patient and/or guardian, who gave verbal consent to proceed.  [name] was referred by Aliene Beams, MD.     [name] is a 63 y.o. female presenting for an evaluation of allergies, asthma and eczema .  Asthma/Respiratory Symptom History: Her asthma was first identified nearly ten years ago following a severe cold. She experienced two asthma flare-ups this past winter, one in December associated with bronchitis and a persistent cough, and another in February related to the flu. She does not frequently use her inhaler and has not required prednisone or hospital visits for asthma management. No nocturnal coughing is present.  Allergic Rhinitis Symptom History: She experiences persistent allergy symptoms that occur year-round, which she  attributes more to sinus issues than allergies. She underwent turbinate reduction surgery in August 2023, which significantly improved her symptoms. She uses mometasone nasal spray, which she started after her surgery, and finds it effective. She is sensitive to strong smells, which can trigger migraines. She is concerned about potential allergies after her daughter had skin testing that revealed additional allergies. She has a history of childhood allergies and received allergy shots during high school and again about ten years ago, but did not notice significant improvement. She previously used other nasal sprays but found them ineffective. She has mometasone nasal spray to use as needed. Usage has decreased since her turbinectomy.   Food Allergy Symptom History: She has food allergies to crabs and lobster, which cause hives and an itchy throat, respectively. She was also found to be allergic to oats during testing, although she has never had a reaction to them and consumed oatmeal regularly in the past.  Skin Symptom History: She manages her eczema with daily moisturization. Her eczema flares are sometimes influenced by weather conditions, particularly during hot weather when she walks frequently.  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 This is the only location where she experiences arthritis symptoms.   Otherwise, there is no history of other atopic diseases, including drug allergies, stinging insect allergies, or contact dermatitis. There is no significant infectious history. Vaccinations are up to date.    Past Medical History: Patient Active Problem List   Diagnosis Date Noted   Abnormal cervical Papanicolaou smear 11/02/2021   Allergies 11/02/2021   Hayfever 11/02/2021   Asthma 05/27/2019   Migraine headache 05/27/2019    Medication List:  Allergies as of 12/18/2023       Reactions   Crab Extract  Hives   Oat Grain Extract Allergy Skin Test Hives, Itching, Dermatitis   Penicillins Hives, Itching   Shellfish Allergy Dermatitis, Hives, Itching, Rash        Medication List        Accurate as of December 18, 2023  2:42 PM. If you have any questions, ask your nurse or doctor.          STOP taking these medications    prednisoLONE acetate 1 % ophthalmic suspension Commonly known as: PRED FORTE Stopped by: Alfonse Spruce       TAKE these medications    albuterol 108 (90 Base) MCG/ACT inhaler Commonly known as: VENTOLIN HFA   albuterol (2.5 MG/3ML) 0.083% nebulizer solution Commonly known as: PROVENTIL Take 2.5 mg by nebulization every 4 (four) hours as needed.   amLODipine 2.5 MG tablet Commonly known as: NORVASC Take 2.5 mg by mouth daily.   cyclobenzaprine 10 MG tablet Commonly known as: FLEXERIL cyclobenzaprine 10 mg tablet  TAKE 1/2 TO 1 TABLET BY MOUTH EVERY DAY AS NEEDED   estradiol 0.1 MG/24HR patch Commonly known as: VIVELLE-DOT estradiol 0.1 mg/24 hr semiweekly transdermal patch  APPLY 1 PATCH TWICE WEEKLY   gabapentin 300 MG  capsule Commonly known as: NEURONTIN gabapentin 300 mg capsule  TAKE 1 CAPSULE BY MOUTH THREE TIMES DAILY   ketoconazole 2 % shampoo Commonly known as: NIZORAL Apply 1 Application topically every 14 (fourteen) days.   levocetirizine 5 MG tablet Commonly known as: XYZAL SMARTSIG:1 Tablet(s) By Mouth Every Evening   mometasone 50 MCG/ACT nasal spray Commonly known as: NASONEX Place 2 sprays into the nose daily.   montelukast 10 MG tablet Commonly known as: SINGULAIR montelukast 10 mg tablet  TAKE 1 TABLET BY MOUTH EVERY DAY   Multi Vitamin Tabs Take 1 tablet by mouth in the morning.   Restasis 0.05 % ophthalmic emulsion Generic drug: cycloSPORINE Place 1 drop into both eyes daily.   SUMAtriptan 50 MG tablet Commonly known as: IMITREX sumatriptan 50 mg tablet  TAKE 1 TABLET BY MOUTH AS NEEDED FOR  MIGRAINE        Birth History: non-contributory  Developmental History: non-contributory  Past Surgical History: Past Surgical History:  Procedure Laterality Date   HYSTEROTOMY N/A 2015   TURBINATE REDUCTION N/A 04/2022     Family History: History reviewed. No pertinent family history.   Social History: [name] lives at home in a house that is 61 years old. There is hardwood and tile throughout the home. There is gas heating and central cooling. There is one indoor dog. There are dust mite coverings on the pillows, but not on the bedding. 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    Review of systems otherwise negative other than that mentioned in the HPI.    Objective:   Blood pressure 130/88, pulse 87, temperature 98 F (36.7 C), temperature source Temporal, resp. rate 14, height 5\' 7"  (1.702 m), weight 164 lb 14.4 oz (74.8 kg), SpO2 100%. Body mass index is 25.83 kg/m.     Physical Exam Vitals reviewed.  Constitutional:      Appearance: She is well-developed.     Comments: Talkative. Very lovely.   HENT:     Head: Normocephalic and atraumatic.     Right Ear: Tympanic membrane, ear canal and external ear normal. No drainage, swelling or tenderness. Tympanic membrane is not injected, scarred, erythematous, retracted or bulging.     Left Ear: Tympanic membrane, ear canal and external ear normal. No drainage, swelling or tenderness. Tympanic membrane is not injected, scarred, erythematous, retracted or bulging.     Nose: No nasal deformity, septal deviation, mucosal edema or rhinorrhea.     Right Turbinates: Enlarged, swollen and pale.     Left Turbinates: Enlarged, swollen and pale.     Right Sinus: No maxillary sinus tenderness or frontal sinus tenderness.     Left Sinus: No maxillary sinus tenderness or frontal sinus tenderness.     Mouth/Throat:     Lips: Pink.     Mouth:  Mucous membranes are moist. Mucous membranes are not pale and not dry.     Pharynx: Uvula midline.     Comments: Cobblestoning in the posterior oropharynx.  Eyes:     General:        Right eye: No discharge.        Left eye: No discharge.     Conjunctiva/sclera: Conjunctivae normal.     Right eye: Right conjunctiva is not injected. No chemosis.    Left eye: Left conjunctiva is not injected. No chemosis.    Pupils: Pupils are equal, round, and reactive  to light.  Cardiovascular:     Rate and Rhythm: Normal rate and regular rhythm.     Heart sounds: Normal heart sounds.  Pulmonary:     Effort: Pulmonary effort is normal. No tachypnea, accessory muscle usage or respiratory distress.     Breath sounds: Normal breath sounds. No wheezing, rhonchi or rales.     Comments: Moving air well in all lung fields. No increased work of breathing noted.  Chest:     Chest wall: No tenderness.  Abdominal:     Tenderness: There is no abdominal tenderness. There is no guarding or rebound.  Lymphadenopathy:     Head:     Right side of head: No submandibular, tonsillar or occipital adenopathy.     Left side of head: No submandibular, tonsillar or occipital adenopathy.     Cervical: No cervical adenopathy.  Skin:    Coloration: Skin is not pale.     Findings: No abrasion, erythema, petechiae or rash. Rash is not papular, urticarial or vesicular.  Neurological:     Mental Status: She is alert.  Psychiatric:        Behavior: Behavior is cooperative.      Diagnostic studies:    Spirometry: results normal (FEV1: 2.99/127%, FVC: 3.75/1026%, FEV1/FVC: 80%).    Spirometry consistent with normal pattern.   Allergy Studies: none           Malachi Bonds, MD [facility]

## 2023-12-18 NOTE — Progress Notes (Signed)
spiro

## 2023-12-25 ENCOUNTER — Ambulatory Visit: Admitting: Allergy

## 2023-12-25 ENCOUNTER — Encounter: Payer: Self-pay | Admitting: Allergy

## 2023-12-25 DIAGNOSIS — J310 Chronic rhinitis: Secondary | ICD-10-CM

## 2023-12-25 DIAGNOSIS — J3089 Other allergic rhinitis: Secondary | ICD-10-CM

## 2023-12-25 DIAGNOSIS — T781XXD Other adverse food reactions, not elsewhere classified, subsequent encounter: Secondary | ICD-10-CM | POA: Diagnosis not present

## 2023-12-25 MED ORDER — ALBUTEROL SULFATE HFA 108 (90 BASE) MCG/ACT IN AERS: 2.0000 | INHALATION_SPRAY | RESPIRATORY_TRACT | 1 refills | Status: AC | PRN

## 2023-12-25 NOTE — Progress Notes (Signed)
 Follow-up Note  RE: IRIDIAN READER MRN: [id number] DOB: June 21, 1961 Date of Office Visit: 12/25/2023   History of present illness: [name] is a 63 y.o. female presenting today for skin testing visit.  She was last seen in the office on 12/18/2023 by Dr. Dellis Anes for asthma, chronic rhinitis, atopic dermatitis, shellfish allergy, oat sensitization.  She is in her usual state of health today without recent antihistamine use.  Medication List: Current Outpatient Medications  Medication Sig Dispense Refill   albuterol (PROVENTIL) (2.5 MG/3ML) 0.083% nebulizer solution Take 2.5 mg by nebulization every 4 (four) hours as needed.     albuterol (VENTOLIN HFA) 108 (90 Base) MCG/ACT inhaler      amLODipine (NORVASC) 2.5 MG tablet Take 2.5 mg by mouth daily.     cyclobenzaprine (FLEXERIL) 10 MG tablet cyclobenzaprine 10 mg tablet  TAKE 1/2 TO 1 TABLET BY MOUTH EVERY DAY AS NEEDED     estradiol (VIVELLE-DOT) 0.1 MG/24HR patch estradiol 0.1 mg/24 hr semiweekly transdermal patch  APPLY 1 PATCH TWICE WEEKLY     gabapentin (NEURONTIN) 300 MG capsule gabapentin 300 mg capsule  TAKE 1 CAPSULE BY MOUTH THREE TIMES DAILY     ketoconazole (NIZORAL) 2 % shampoo Apply 1 Application topically every 14 (fourteen) days.     levocetirizine (XYZAL) 5 MG tablet SMARTSIG:1 Tablet(s) By Mouth Every Evening     mometasone (NASONEX) 50 MCG/ACT nasal spray Place 2 sprays into the nose daily.     montelukast (SINGULAIR) 10 MG tablet montelukast 10 mg tablet  TAKE 1 TABLET BY MOUTH EVERY DAY     Multiple Vitamin (MULTI VITAMIN) TABS Take 1 tablet by mouth in the morning.     RESTASIS 0.05 % ophthalmic emulsion Place 1 drop into both eyes daily.     SUMAtriptan (IMITREX) 50 MG tablet sumatriptan 50 mg tablet  TAKE 1 TABLET BY MOUTH AS NEEDED FOR MIGRAINE     No current facility-administered medications for this visit.     Known medication allergies: Allergies  Allergen Reactions   Crab Extract Hives    Oat Grain Extract Allergy Skin Test Hives, Itching and Dermatitis   Penicillins Hives and Itching   Shellfish Allergy Dermatitis, Hives, Itching and Rash    Diagnositics/Labs:  Allergy testing:   Airborne Adult Perc - 12/25/23 1400     Time Antigen Placed 1436    Allergen Manufacturer Waynette Buttery    Location Back    Number of Test 55    Panel 1 Select    1. Control-Buffer 50% Glycerol Negative    2. Control-Histamine 2+    3. Bahia 4+    4. [place] 4+    5. Johnson 4+    6. Kentucky Blue 4+    7. Meadow Fescue 4+    8. Perennial Rye 4+    9. Timothy 4+    10. Ragweed Mix 3+    11. Cocklebur 3+    12. Plantain,  English 2+    13. Baccharis 2+    14. Dog Fennel 3+    15. Russian Thistle 3+    16. Lamb's Quarters 4+    17. Sheep Sorrell 4+    18. Rough Pigweed 4+    19. Marsh Elder, Rough 2+    20. Mugwort, Common 3+    21. Box, Elder 4+    22. Cedar, red 4+    23. Sweet Gum 4+    24. Pecan Pollen 3+    25. [employer]  Mix Negative    26. Walnut, Black Pollen 3+    27. Red Mulberry 4+    28. Ash Mix 4+    29. Birch Mix 4+    30. Beech American 3+    31. Cottonwood, Eastern 4+    32. Hickory, White 2+    33. Maple Mix 2+    34. Oak, [place] Mix 3+    35. Sycamore Eastern 4+    36. Alternaria Alternata Negative    37. Cladosporium Herbarum Negative    38. Aspergillus Mix Negative    39. Penicillium Mix Negative    40. Bipolaris Sorokiniana (Helminthosporium) Negative    41. Drechslera Spicifera (Curvularia) Negative    42. Mucor Plumbeus Negative    43. Fusarium Moniliforme Negative    44. Aureobasidium Pullulans (pullulara) Negative    45. Rhizopus Oryzae Negative    46. Botrytis Cinera Negative    47. Epicoccum Nigrum Negative    48. Phoma Betae Negative    49. Dust Mite Mix 3+    50. Cat Hair 10,000 BAU/ml 2+    51.  Dog Epithelia 2+    52. Mixed Feathers Negative    53. Horse Epithelia Negative    54. Cockroach, German 2+    55. Tobacco Leaf Negative              Intradermal - 12/25/23 1500     Time Antigen Placed 1521    Allergen Manufacturer Floyd Hutchinson    Location Arm    Number of Test 4    Mold 1 Negative    Mold 2 Negative    Mold 3 Negative    Mold 4 2+             Food Adult Perc - 12/25/23 1500     18. Trout Negative    19. Tuna Negative    20. Salmon Negative    21. Flounder Negative    22. Codfish Negative    23. Shrimp Negative    24. Crab Negative    25. Lobster Negative    26. Oyster Negative    27. Scallops Negative    Comments Oat is negative             Allergy testing results were read and interpreted by provider, documented by clinical staff.   Assessment and plan:   Chronic rhinitis - Testing today showed: grasses, ragweed, weeds, trees, dust mites, cat, dog, molds and cockroach - Copy of test results provided.  - Avoidance measures provided. - Our office will be in touch with you regarding medication recommendations by phone or Mychart in next week - Consider nasal saline rinses 1-2 times daily to remove allergens from the nasal cavities as well as help with mucous clearance (this is especially helpful to do before the nasal sprays are given) - Consider allergy shots as a means of long-term control.  Allergy shots "re-train" and "reset" the immune system to ignore environmental allergens and decrease the resulting immune response to those allergens (sneezing, itchy watery eyes, runny nose, nasal congestion, etc). Allergy shots improve symptoms in 75-85% of patients.  This ca be discuss more at a future appointment if the medications are not working for you.   Food allergy/sensitization - Skin testing today is negative for fish panel, shellfish panel and oat.   - We will provide further recommendations if needed regarding these foods  Mild persistent asthma - Daily controller medication(s): NONE - Prior to physical activity: albuterol 2  puffs 10-15 minutes before physical activity. - Rescue  medications: albuterol 4 puffs every 4-6 hours as needed - Asthma control goals:  * Full participation in all desired activities (may need albuterol before activity) * Albuterol use two time or less a week on average (not counting use with activity) * Cough interfering with sleep two time or less a month * Oral steroids no more than once a year * No hospitalizations  Atopic dermatitis - Continue your moisturization especially after bathing  Follow-up in 3-4 months or sooner if needed  I appreciate the opportunity to take part in [place] care. Please do not hesitate to contact me with questions.  Sincerely,   Catha Clink, MD Allergy/Immunology [facility]

## 2023-12-25 NOTE — Patient Instructions (Addendum)
 Chronic rhinitis - Testing today showed: grasses, ragweed, weeds, trees, dust mites, cat, dog, molds and cockroach - Copy of test results provided.  - Avoidance measures provided. - Our office will be in touch with you regarding medication recommendations by phone or Mychart in next week - Consider nasal saline rinses 1-2 times daily to remove allergens from the nasal cavities as well as help with mucous clearance (this is especially helpful to do before the nasal sprays are given) - Consider allergy shots as a means of long-term control.  Allergy shots "re-train" and "reset" the immune system to ignore environmental allergens and decrease the resulting immune response to those allergens (sneezing, itchy watery eyes, runny nose, nasal congestion, etc). Allergy shots improve symptoms in 75-85% of patients.  This ca be discuss more at a future appointment if the medications are not working for you.   Food allergy/sensitization - Skin testing today is negative for fish panel, shellfish panel and oat.   - We will provide further recommendations if needed regarding these foods  Mild persistent asthma - Daily controller medication(s): NONE - Prior to physical activity: albuterol 2 puffs 10-15 minutes before physical activity. - Rescue medications: albuterol 4 puffs every 4-6 hours as needed - Asthma control goals:  * Full participation in all desired activities (may need albuterol before activity) * Albuterol use two time or less a week on average (not counting use with activity) * Cough interfering with sleep two time or less a month * Oral steroids no more than once a year * No hospitalizations  Atopic dermatitis - Continue your moisturization especially after bathing  Follow-up in 3-4 months or sooner if needed

## 2024-01-02 ENCOUNTER — Encounter: Payer: Self-pay | Admitting: Allergy & Immunology

## 2024-10-12 ENCOUNTER — Other Ambulatory Visit: Payer: Self-pay | Admitting: Obstetrics and Gynecology

## 2024-10-12 DIAGNOSIS — R928 Other abnormal and inconclusive findings on diagnostic imaging of breast: Secondary | ICD-10-CM

## 2024-10-14 ENCOUNTER — Inpatient Hospital Stay
Admission: RE | Admit: 2024-10-14 | Discharge: 2024-10-14 | Payer: Self-pay | Attending: Obstetrics and Gynecology | Admitting: Obstetrics and Gynecology

## 2024-10-14 ENCOUNTER — Other Ambulatory Visit: Payer: Self-pay | Admitting: Obstetrics and Gynecology

## 2024-10-14 DIAGNOSIS — N6324 Unspecified lump in the left breast, lower inner quadrant: Secondary | ICD-10-CM

## 2024-10-14 DIAGNOSIS — R928 Other abnormal and inconclusive findings on diagnostic imaging of breast: Secondary | ICD-10-CM

## 2024-10-18 ENCOUNTER — Other Ambulatory Visit
